# Patient Record
Sex: Female | Born: 1965 | Race: Black or African American | Hispanic: No | Marital: Married | Smoking: Never smoker
Health system: Southern US, Community
[De-identification: ages and names within clinical notes are randomized; demographics above are authoritative.]

## PROBLEM LIST (undated history)

## (undated) DIAGNOSIS — I10 Essential (primary) hypertension: Secondary | ICD-10-CM

## (undated) DIAGNOSIS — E119 Type 2 diabetes mellitus without complications: Secondary | ICD-10-CM

## (undated) HISTORY — PX: CHOLECYSTECTOMY: SHX55

## (undated) HISTORY — PX: ABDOMINAL HYSTERECTOMY: SHX81

---

## 1997-07-05 ENCOUNTER — Encounter (HOSPITAL_COMMUNITY): Admission: RE | Admit: 1997-07-05 | Discharge: 1997-10-03 | Payer: Self-pay

## 2014-12-12 ENCOUNTER — Emergency Department (HOSPITAL_BASED_OUTPATIENT_CLINIC_OR_DEPARTMENT_OTHER): Payer: 59

## 2014-12-12 ENCOUNTER — Encounter (HOSPITAL_BASED_OUTPATIENT_CLINIC_OR_DEPARTMENT_OTHER): Payer: Self-pay | Admitting: *Deleted

## 2014-12-12 ENCOUNTER — Emergency Department (HOSPITAL_BASED_OUTPATIENT_CLINIC_OR_DEPARTMENT_OTHER)
Admission: EM | Admit: 2014-12-12 | Discharge: 2014-12-12 | Disposition: A | Discharge status: Home / Self Care | Payer: 59 | Attending: Emergency Medicine | Admitting: Emergency Medicine

## 2014-12-12 DIAGNOSIS — Z79899 Other long term (current) drug therapy: Secondary | ICD-10-CM | POA: Insufficient documentation

## 2014-12-12 DIAGNOSIS — I10 Essential (primary) hypertension: Secondary | ICD-10-CM | POA: Diagnosis not present

## 2014-12-12 DIAGNOSIS — E119 Type 2 diabetes mellitus without complications: Secondary | ICD-10-CM | POA: Diagnosis not present

## 2014-12-12 DIAGNOSIS — R079 Chest pain, unspecified: Secondary | ICD-10-CM | POA: Insufficient documentation

## 2014-12-12 HISTORY — DX: Type 2 diabetes mellitus without complications: E11.9

## 2014-12-12 HISTORY — DX: Essential (primary) hypertension: I10

## 2014-12-12 LAB — CBC
HEMATOCRIT: 39.7 % (ref 36.0–46.0)
Hemoglobin: 13.8 g/dL (ref 12.0–15.0)
MCH: 26.8 pg (ref 26.0–34.0)
MCHC: 34.8 g/dL (ref 30.0–36.0)
MCV: 77.2 fL — AB (ref 78.0–100.0)
Platelets: 269 10*3/uL (ref 150–400)
RBC: 5.14 MIL/uL — ABNORMAL HIGH (ref 3.87–5.11)
RDW: 13.4 % (ref 11.5–15.5)
WBC: 9.6 10*3/uL (ref 4.0–10.5)

## 2014-12-12 LAB — BASIC METABOLIC PANEL
Anion gap: 6 (ref 5–15)
BUN: 11 mg/dL (ref 6–20)
CHLORIDE: 103 mmol/L (ref 101–111)
CO2: 27 mmol/L (ref 22–32)
CREATININE: 0.67 mg/dL (ref 0.44–1.00)
Calcium: 9.2 mg/dL (ref 8.9–10.3)
GFR calc Af Amer: >60 mL/min (ref >60)
GFR calc non Af Amer: >60 mL/min (ref >60)
GLUCOSE: 153 mg/dL — AB (ref 65–99)
POTASSIUM: 3.9 mmol/L (ref 3.5–5.1)
Sodium: 136 mmol/L (ref 135–145)

## 2014-12-12 LAB — TROPONIN I: Troponin I: <0.03 ng/mL (ref <0.031)

## 2014-12-12 MED ORDER — KETOROLAC TROMETHAMINE 30 MG/ML IJ SOLN
30.0000 mg | Freq: Once | INTRAMUSCULAR | Status: AC
  Administered 2014-12-12: 30 mg via INTRAVENOUS
  Filled 2014-12-12: qty 1

## 2014-12-12 NOTE — ED Provider Notes (Signed)
CSN: [id number]     Arrival date & time 12/12/14  1634 History   First MD Initiated Contact with Patient 12/12/14 1651     Chief Complaint  Patient presents with  . Chest Pain     (Consider location/radiation/quality/duration/timing/severity/associated sxs/prior Treatment) HPI Comments: Patient is a 49 year old female with history of hypertension and diabetes. She presents for evaluation of chest discomfort which started this morning on her way to work. He states that she feels a heaviness to the left upper chest that radiates to her shoulder. She denies any shortness of breath, nausea, diaphoresis. She denies any recent exertional symptoms and reports doing Zumba several times per week. She has no prior cardiac history and denies having ever had a stress test.  Patient is a 49 y.o. female presenting with chest pain. The history is provided by the patient.  Chest Pain Pain location:  L chest Pain quality: tightness   Pain radiates to:  L shoulder Pain radiates to the back: no   Pain severity:  Mild Onset quality:  Sudden Duration:  8 hours Timing:  Constant Progression:  Unchanged Chronicity:  New Relieved by:  Nothing Worsened by:  Nothing tried Ineffective treatments:  None tried   Past Medical History  Diagnosis Date  . Diabetes mellitus without complication (HCC)   . Hypertension    Past Surgical History  Procedure Laterality Date  . Abdominal hysterectomy    . Cholecystectomy     No family history on file. Social History  Substance Use Topics  . Smoking status: Never Smoker   . Smokeless tobacco: None  . Alcohol Use: Yes     Comment: occasionally   OB History    No data available     Review of Systems  Cardiovascular: Positive for chest pain.  All other systems reviewed and are negative.     Allergies  Codeine  Home Medications   Prior to Admission medications   Medication Sig Start Date End Date Taking? Authorizing Provider  Carvedilol (COREG PO)  Take by mouth.   Yes Historical Provider, MD  METFORMIN HCL PO Take by mouth.   Yes Historical Provider, MD   BP 160/98 mmHg  Pulse 90  Temp(Src) 98.1 F (36.7 C) (Oral)  Resp 20  Ht 5\' 5"  (1.651 m)  Wt 189 lb (85.73 kg)  BMI 31.45 kg/m2  SpO2 98% Physical Exam  Constitutional: She is oriented to person, place, and time. She appears well-developed and well-nourished. No distress.  HENT:  Head: Normocephalic and atraumatic.  Neck: Normal range of motion. Neck supple.  Cardiovascular: Normal rate and regular rhythm.  Exam reveals no gallop and no friction rub.   No murmur heard. Pulmonary/Chest: Effort normal and breath sounds normal. No respiratory distress. She has no wheezes.  Abdominal: Soft. Bowel sounds are normal. She exhibits no distension. There is no tenderness.  Musculoskeletal: Normal range of motion.  Neurological: She is alert and oriented to person, place, and time.  Skin: Skin is warm and dry. She is not diaphoretic.  Nursing note and vitals reviewed.   ED Course  Procedures (including critical care time) Labs Review Labs Reviewed  CBC - Abnormal; Notable for the following:    RBC 5.14 (*)    MCV 77.2 (*)    All other components within normal limits  BASIC METABOLIC PANEL  TROPONIN I    Imaging Review Dg Chest 2 View  12/12/2014  CLINICAL DATA:  49 year old female with left-sided chest pain and left arm numbness  EXAM: CHEST  2 VIEW COMPARISON:  None. FINDINGS: The lungs are clear and negative for focal airspace consolidation, pulmonary edema or suspicious pulmonary nodule. No pleural effusion or pneumothorax. Cardiac and mediastinal contours are within normal limits. No acute fracture or lytic or blastic osseous lesions. The visualized upper abdominal bowel gas pattern is unremarkable. Surgical clips in the right upper quadrant suggest prior cholecystectomy. IMPRESSION: Negative chest x-ray. Electronically Signed   By: Malachy Moan M.D.   On: 12/12/2014  17:03   I have personally reviewed and evaluated these images and lab results as part of my medical decision-making.   EKG Interpretation   Date/Time:  Thursday December 12 2014 16:45:28 EDT Ventricular Rate:  87 PR Interval:  148 QRS Duration: 76 QT Interval:  362 QTC Calculation: 435 R Axis:   -20 Text Interpretation:  Normal sinus rhythm Possible Left atrial enlargement  Left ventricular hypertrophy Abnormal ECG Confirmed by Louise Rawson  MD, Martyna Thorns  ([id number]) on 12/12/2014 4:54:18 PM      MDM   Final diagnoses:  None    Patient is a 49 year old female who presents with chest discomfort and left arm pain that is atypical for heart pain. Her EKG is normal and troponin is -8 hours after the onset of symptoms. She reports moving furniture when she was helping her son move and I suspect this may be musculoskeletal in nature. She will be given Toradol and I believe is appropriate for discharge. I will provide her with the follow-up information for [facility] cardiology office as she does have risk factors. She understands to return if her symptoms significantly worsen or change.    Geoffery Lyons, MD 12/12/14 [phone]

## 2014-12-12 NOTE — Discharge Instructions (Signed)
Ibuprofen 600 mg every 6 hours as needed for pain.  Follow-up with your primary Dr. to discuss a follow-up appointment with cardiology. A stress test may be in your best interest.  Return to the ER if symptoms significantly worsen or change.   Nonspecific Chest Pain  Chest pain can be caused by many different conditions. There is always a chance that your pain could be related to something serious, such as a heart attack or a blood clot in your lungs. Chest pain can also be caused by conditions that are not life-threatening. If you have chest pain, it is very important to follow up with your health care provider. CAUSES  Chest pain can be caused by:  Heartburn.  Pneumonia or bronchitis.  Anxiety or stress.  Inflammation around your heart (pericarditis) or lung (pleuritis or pleurisy).  A blood clot in your lung.  A collapsed lung (pneumothorax). It can develop suddenly on its own (spontaneous pneumothorax) or from trauma to the chest.  Shingles infection (varicella-zoster virus).  Heart attack.  Damage to the bones, muscles, and cartilage that make up your chest wall. This can include:  Bruised bones due to injury.  Strained muscles or cartilage due to frequent or repeated coughing or overwork.  Fracture to one or more ribs.  Sore cartilage due to inflammation (costochondritis). RISK FACTORS  Risk factors for chest pain may include:  Activities that increase your risk for trauma or injury to your chest.  Respiratory infections or conditions that cause frequent coughing.  Medical conditions or overeating that can cause heartburn.  Heart disease or family history of heart disease.  Conditions or health behaviors that increase your risk of developing a blood clot.  Having had chicken pox (varicella zoster). SIGNS AND SYMPTOMS Chest pain can feel like:  Burning or tingling on the surface of your chest or deep in your chest.  Crushing, pressure, aching, or squeezing  pain.  Dull or sharp pain that is worse when you move, cough, or take a deep breath.  Pain that is also felt in your back, neck, shoulder, or arm, or pain that spreads to any of these areas. Your chest pain may come and go, or it may stay constant. DIAGNOSIS Lab tests or other studies may be needed to find the cause of your pain. Your health care provider may have you take a test called an ambulatory ECG (electrocardiogram). An ECG records your heartbeat patterns at the time the test is performed. You may also have other tests, such as:  Transthoracic echocardiogram (TTE). During echocardiography, sound waves are used to create a picture of all of the heart structures and to look at how blood flows through your heart.  Transesophageal echocardiogram (TEE).This is a more advanced imaging test that obtains images from inside your body. It allows your health care provider to see your heart in finer detail.  Cardiac monitoring. This allows your health care provider to monitor your heart rate and rhythm in real time.  Holter monitor. This is a portable device that records your heartbeat and can help to diagnose abnormal heartbeats. It allows your health care provider to track your heart activity for several days, if needed.  Stress tests. These can be done through exercise or by taking medicine that makes your heart beat more quickly.  Blood tests.  Imaging tests. TREATMENT  Your treatment depends on what is causing your chest pain. Treatment may include:  Medicines. These may include:  Acid blockers for heartburn.  Anti-inflammatory medicine.  Pain medicine for inflammatory conditions.  Antibiotic medicine, if an infection is present.  Medicines to dissolve blood clots.  Medicines to treat coronary artery disease.  Supportive care for conditions that do not require medicines. This may include:  Resting.  Applying heat or cold packs to injured areas.  Limiting activities  until pain decreases. HOME CARE INSTRUCTIONS  If you were prescribed an antibiotic medicine, finish it all even if you start to feel better.  Avoid any activities that bring on chest pain.  Do not use any tobacco products, including cigarettes, chewing tobacco, or electronic cigarettes. If you need help quitting, ask your health care provider.  Do not drink alcohol.  Take medicines only as directed by your health care provider.  Keep all follow-up visits as directed by your health care provider. This is important. This includes any further testing if your chest pain does not go away.  If heartburn is the cause for your chest pain, you may be told to keep your head raised (elevated) while sleeping. This reduces the chance that acid will go from your stomach into your esophagus.  Make lifestyle changes as directed by your health care provider. These may include:  Getting regular exercise. Ask your health care provider to suggest some activities that are safe for you.  Eating a heart-healthy diet. A registered dietitian can help you to learn healthy eating options.  Maintaining a healthy weight.  Managing diabetes, if necessary.  Reducing stress. SEEK MEDICAL CARE IF:  Your chest pain does not go away after treatment.  You have a rash with blisters on your chest.  You have a fever. SEEK IMMEDIATE MEDICAL CARE IF:   Your chest pain is worse.  You have an increasing cough, or you cough up blood.  You have severe abdominal pain.  You have severe weakness.  You faint.  You have chills.  You have sudden, unexplained chest discomfort.  You have sudden, unexplained discomfort in your arms, back, neck, or jaw.  You have shortness of breath at any time.  You suddenly start to sweat, or your skin gets clammy.  You feel nauseous or you vomit.  You suddenly feel light-headed or dizzy.  Your heart begins to beat quickly, or it feels like it is skipping beats. These  symptoms may represent a serious problem that is an emergency. Do not wait to see if the symptoms will go away. Get medical help right away. Call your local emergency services (911 in the [place] Do not drive yourself to the hospital.   This information is not intended to replace advice given to you by your health care provider. Make sure you discuss any questions you have with your health care provider.   Document Released: 11/04/2004 Document Revised: 02/15/2014 Document Reviewed: 08/31/2013 Elsevier Interactive Patient Education [employer].

## 2014-12-12 NOTE — ED Notes (Signed)
Chest pain 3 days ago that went away. On the way to work this am she had pressure in her chest and tingling in her left arm.

## 2017-09-06 ENCOUNTER — Emergency Department (HOSPITAL_BASED_OUTPATIENT_CLINIC_OR_DEPARTMENT_OTHER)
Admission: EM | Admit: 2017-09-06 | Discharge: 2017-09-06 | Disposition: A | Discharge status: Home / Self Care | Payer: 59 | Attending: Emergency Medicine | Admitting: Emergency Medicine

## 2017-09-06 ENCOUNTER — Other Ambulatory Visit: Payer: Self-pay

## 2017-09-06 ENCOUNTER — Encounter (HOSPITAL_BASED_OUTPATIENT_CLINIC_OR_DEPARTMENT_OTHER): Payer: Self-pay | Admitting: *Deleted

## 2017-09-06 ENCOUNTER — Emergency Department (HOSPITAL_BASED_OUTPATIENT_CLINIC_OR_DEPARTMENT_OTHER): Payer: 59

## 2017-09-06 DIAGNOSIS — E119 Type 2 diabetes mellitus without complications: Secondary | ICD-10-CM | POA: Insufficient documentation

## 2017-09-06 DIAGNOSIS — S161XXA Strain of muscle, fascia and tendon at neck level, initial encounter: Secondary | ICD-10-CM | POA: Diagnosis not present

## 2017-09-06 DIAGNOSIS — S39012A Strain of muscle, fascia and tendon of lower back, initial encounter: Secondary | ICD-10-CM | POA: Insufficient documentation

## 2017-09-06 DIAGNOSIS — Y999 Unspecified external cause status: Secondary | ICD-10-CM | POA: Diagnosis not present

## 2017-09-06 DIAGNOSIS — Y9389 Activity, other specified: Secondary | ICD-10-CM | POA: Insufficient documentation

## 2017-09-06 DIAGNOSIS — I10 Essential (primary) hypertension: Secondary | ICD-10-CM | POA: Diagnosis not present

## 2017-09-06 DIAGNOSIS — S199XXA Unspecified injury of neck, initial encounter: Secondary | ICD-10-CM | POA: Diagnosis present

## 2017-09-06 DIAGNOSIS — Y92410 Unspecified street and highway as the place of occurrence of the external cause: Secondary | ICD-10-CM | POA: Diagnosis not present

## 2017-09-06 MED ORDER — METHOCARBAMOL 500 MG PO TABS
500.0000 mg | ORAL_TABLET | Freq: Once | ORAL | Status: AC
  Administered 2017-09-06: 500 mg via ORAL
  Filled 2017-09-06: qty 1

## 2017-09-06 MED ORDER — METHOCARBAMOL 500 MG PO TABS
500.0000 mg | ORAL_TABLET | Freq: Three times a day (TID) | ORAL | 0 refills | Status: AC | PRN
Start: 2017-09-06 — End: ?

## 2017-09-06 MED ORDER — IBUPROFEN 800 MG PO TABS
800.0000 mg | ORAL_TABLET | Freq: Once | ORAL | Status: AC
  Administered 2017-09-06: 800 mg via ORAL
  Filled 2017-09-06: qty 1

## 2017-09-06 MED ORDER — IBUPROFEN 800 MG PO TABS: 800.0000 mg | ORAL_TABLET | Freq: Three times a day (TID) | ORAL | 0 refills | Status: AC | PRN

## 2017-09-06 MED FILL — METHOCARBAMOL 500 MG TABLET: 500 | 7 days supply | Qty: 20 | Fill #0

## 2017-09-06 MED FILL — IBUPROFEN 800 MG TAB: 800 | 7 days supply | Qty: 21 | Fill #0
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
## 2017-09-06 NOTE — Discharge Instructions (Signed)

## 2017-09-06 NOTE — ED Notes (Signed)
Pt verbalized understanding of dc instructions.
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
## 2021-03-16 ENCOUNTER — Emergency Department (HOSPITAL_BASED_OUTPATIENT_CLINIC_OR_DEPARTMENT_OTHER)
Admission: EM | Admit: 2021-03-16 | Discharge: 2021-03-16 | Disposition: A | Discharge status: Home / Self Care | Payer: 59 | Attending: Emergency Medicine | Admitting: Emergency Medicine

## 2021-03-16 ENCOUNTER — Other Ambulatory Visit: Payer: Self-pay

## 2021-03-16 ENCOUNTER — Encounter (HOSPITAL_BASED_OUTPATIENT_CLINIC_OR_DEPARTMENT_OTHER): Payer: Self-pay | Admitting: *Deleted

## 2021-03-16 DIAGNOSIS — R000 Tachycardia, unspecified: Secondary | ICD-10-CM | POA: Insufficient documentation

## 2021-03-16 DIAGNOSIS — J029 Acute pharyngitis, unspecified: Secondary | ICD-10-CM | POA: Diagnosis present

## 2021-03-16 DIAGNOSIS — U071 COVID-19: Secondary | ICD-10-CM | POA: Diagnosis not present

## 2021-03-16 LAB — RESP PANEL BY RT-PCR (FLU A&B, COVID) ARPGX2
Influenza A by PCR: NEGATIVE
Influenza B by PCR: NEGATIVE
SARS Coronavirus 2 by RT PCR: POSITIVE — AB

## 2021-03-16 MED ORDER — NIRMATRELVIR/RITONAVIR (PAXLOVID)TABLET: 3.0000 | ORAL_TABLET | Freq: Two times a day (BID) | ORAL | 0 refills | Status: AC

## 2021-03-16 MED ORDER — BENZONATATE 100 MG PO CAPS: 100.0000 mg | ORAL_CAPSULE | Freq: Three times a day (TID) | ORAL | 0 refills | Status: AC

## 2021-03-16 MED ORDER — ACETAMINOPHEN 325 MG PO TABS
650.0000 mg | ORAL_TABLET | Freq: Once | ORAL | Status: AC
  Administered 2021-03-16: 650 mg via ORAL
  Filled 2021-03-16: qty 2

## 2021-03-16 MED ORDER — FLUTICASONE PROPIONATE 50 MCG/ACT NA SUSP: 2.0000 | Freq: Every day | NASAL | 0 refills | Status: AC

## 2021-03-16 NOTE — ED Provider Notes (Addendum)
[facility] DEPARTMENT Provider Note   CSN: [id number] Arrival date & time: 03/16/21  1844     History  Chief Complaint  Patient presents with   Covid Exposure    Hannah Horn is a 56 y.o. female here for evaluation of fever sore throat cough body aches headache myalgia, chest pain when coughing.  Significant other recently diagnosed with COVID.  She took a test at home this morning which was negative.  No meds PTA.  No pleuritic or exertional chest pain.  No back pain.  No paresthesias, lateral weakness.  Does feel generalized fatigue.  Tolerating p.o. intake.  HPI     Home Medications Prior to Admission medications   Medication Sig Start Date End Date Taking? Authorizing Provider  benzonatate (TESSALON) 100 MG capsule Take 1 capsule (100 mg total) by mouth every 8 (eight) hours. 03/16/21  Yes Bernardo Brayman A, PA-C  carvedilol (COREG CR) 40 MG 24 hr capsule Take 1 capsule by mouth daily. 07/13/16  Yes [provider]  fluticasone (FLONASE) 50 MCG/ACT nasal spray Place 2 sprays into both nostrils daily. 03/16/21  Yes Amar Sippel A, PA-C  nirmatrelvir/ritonavir EUA (PAXLOVID) 20 x 150 MG & 10 x 100MG  TABS Take 3 tablets by mouth 2 (two) times daily for 5 days. Patient GFR is normal Take nirmatrelvir (150 mg) two tablets twice daily for 5 days and ritonavir (100 mg) one tablet twice daily for 5 days. 03/16/21 03/21/21 Yes Chrystle Murillo A, PA-C  Semaglutide, 1 MG/DOSE, (OZEMPIC, 1 MG/DOSE,) 4 MG/3ML SOPN Inject 1 mg into the skin once a week. 01/12/21  Yes [provider]  Carvedilol (COREG PO) Take by mouth.    [provider]  ibuprofen (ADVIL,MOTRIN) 800 MG tablet Take 1 tablet (800 mg total) by mouth every 8 (eight) hours as needed for moderate pain. 09/06/17   Long, Wonda Olds, MD  METFORMIN HCL PO Take by mouth.    [provider]  methocarbamol (ROBAXIN) 500 MG tablet Take 1 tablet (500 mg total) by mouth every 8 (eight)  hours as needed for muscle spasms. 09/06/17   Long, Wonda Olds, MD      Allergies    Codeine    Review of Systems   Review of Systems  Constitutional:  Positive for activity change, appetite change, fatigue and fever.  HENT:  Positive for congestion, postnasal drip, rhinorrhea and sore throat. Negative for ear discharge, ear pain, facial swelling, sinus pressure, sinus pain and sneezing.   Respiratory:  Positive for cough and chest tightness (with coughing).   Cardiovascular: Negative.   Gastrointestinal: Negative.   Genitourinary: Negative.   Musculoskeletal: Negative.   Skin: Negative.   Neurological:  Positive for weakness (generalized) and headaches.  All other systems reviewed and are negative.  Physical Exam Updated Vital Signs BP 137/86 (BP Location: Right Arm)    Pulse (!) 109    Temp 98.9 F (37.2 C) (Oral)    Resp 14    Ht 5\' 5"  (1.651 m)    Wt 83 kg    SpO2 96%    BMI 30.45 kg/m  Physical Exam Vitals and nursing note reviewed.  Constitutional:      General: She is not in acute distress.    Appearance: She is well-developed. She is not ill-appearing, toxic-appearing or diaphoretic.  HENT:     Head: Normocephalic and atraumatic.     Nose: Nose normal.     Mouth/Throat:     Mouth: Mucous membranes  are moist.  Eyes:     Pupils: Pupils are equal, round, and reactive to light.  Cardiovascular:     Rate and Rhythm: Tachycardia present.     Pulses: Normal pulses.     Heart sounds: Normal heart sounds.  Pulmonary:     Effort: Pulmonary effort is normal. No respiratory distress.     Breath sounds: Normal breath sounds.     Comments: Clear bilateral, speaks in full sentences without difficulty Abdominal:     General: Bowel sounds are normal. There is no distension.     Palpations: Abdomen is soft.     Tenderness: There is no abdominal tenderness. There is no guarding or rebound.  Musculoskeletal:        General: Normal range of motion.     Cervical back: Normal range of  motion.     Comments: Full range of motion, no bony tenderness, compartments soft, no posterior calf tenderness  Skin:    General: Skin is warm and dry.     Capillary Refill: Capillary refill takes less than 2 seconds.     Comments: No edema, erythema or warmth.  Neurological:     General: No focal deficit present.     Mental Status: She is alert and oriented to person, place, and time.  Psychiatric:        Mood and Affect: Mood normal.    ED Results / Procedures / Treatments   Labs (all labs ordered are listed, but only abnormal results are displayed) Labs Reviewed  RESP PANEL BY RT-PCR (FLU A&B, COVID) ARPGX2 - Abnormal; Notable for the following components:      Result Value   SARS Coronavirus 2 by RT PCR POSITIVE (*)    All other components within normal limits    EKG EKG Interpretation  Date/Time:  Monday March 16 2021 19:15:02 EST Ventricular Rate:  81 PR Interval:  170 QRS Duration: 80 QT Interval:  400 QTC Calculation: 464 R Axis:   68 Text Interpretation: Normal sinus rhythm Possible Left atrial enlargement Borderline ECG When compared with ECG of 12-Dec-2014 16:45, PREVIOUS ECG IS PRESENT Confirmed by Thamas Jaegers ([id number]) on 03/16/2021 10:12:25 PM  Radiology No results found.  Procedures Procedures    Medications Ordered in ED Medications  acetaminophen (TYLENOL) tablet 650 mg (650 mg Oral Given 03/16/21 1906)    ED Course/ Medical Decision Making/ A&P    55 here for evaluation of respiratory symptoms.  On arrival patient febrile, tachycardic however does not appear septic or ill.  Significant other recently diagnosed with COVID.  Heart and lungs are clear.  Abdomen soft, nontender.  While she does endorse some chest pain with coughing of low suspicion with acute ACS, PE or dissection.  Pain is reproducible on exam, suspect her pain is due to her cough vital signs improving with Tylenol here.  She has no neck stiffness or neck rigidity to suggest meningitis.   She is tolerating p.o. intake.  Labs personally reviewed and interpreted:  COVID test positive  Suspect this is likely the cause of her symptoms.  Will DC home with symptomatic management, return for any worsening symptoms.  Patient agreeable.  The patient has been appropriately medically screened and/or stabilized in the ED. I have low suspicion for any other emergent medical condition which would require further screening, evaluation or treatment in the ED or require inpatient management.  Patient is hemodynamically stable and in no acute distress.  Patient able to ambulate in department prior to ED.  Evaluation does not show acute pathology that would require ongoing or additional emergent interventions while in the emergency department or further inpatient treatment.  I have discussed the diagnosis with the patient and answered all questions.  Pain is been managed while in the emergency department and patient has no further complaints prior to discharge.  Patient is comfortable with plan discussed in room and is stable for discharge at this time.  I have discussed strict return precautions for returning to the emergency department.  Patient was encouraged to follow-up with PCP/specialist refer to at discharge.                            Medical Decision Making Amount and/or Complexity of Data Reviewed External Data Reviewed: labs, radiology and notes. Labs: ordered. Decision-making details documented in ED Course.  Risk OTC drugs. Prescription drug management. Risk Details: Do not feel she needs additional labs, imaging or hospitalization at this time         Final Clinical Impression(s) / ED Diagnoses Final diagnoses:  COVID    Rx / DC Orders ED Discharge Orders          Ordered    benzonatate (TESSALON) 100 MG capsule  Every 8 hours        03/16/21 2210    fluticasone (FLONASE) 50 MCG/ACT nasal spray  Daily        03/16/21 2210    nirmatrelvir/ritonavir EUA  (PAXLOVID) 20 x 150 MG & 10 x 100MG  TABS  2 times daily        03/16/21 2220              \   Corbin Falck A, PA-C 03/16/21 2223    Luna Fuse, MD 03/20/21 1939

## 2021-03-16 NOTE — Discharge Instructions (Signed)
COVID test was positive here.  This is likely the cause of your symptoms  I have written for a few medications.  Take as prescribed.  Return for new or worsening symptoms.

## 2021-03-16 NOTE — ED Triage Notes (Signed)
Her husband has Covid. She woke this am with fever, sore throat and body aches.

## 2023-06-19 ENCOUNTER — Emergency Department (HOSPITAL_BASED_OUTPATIENT_CLINIC_OR_DEPARTMENT_OTHER): Payer: Self-pay

## 2023-06-19 ENCOUNTER — Emergency Department (HOSPITAL_BASED_OUTPATIENT_CLINIC_OR_DEPARTMENT_OTHER)
Admission: EM | Admit: 2023-06-19 | Discharge: 2023-06-19 | Disposition: A | Discharge status: Home / Self Care | Payer: Self-pay | Attending: Emergency Medicine | Admitting: Emergency Medicine

## 2023-06-19 ENCOUNTER — Other Ambulatory Visit: Payer: Self-pay

## 2023-06-19 ENCOUNTER — Encounter (HOSPITAL_BASED_OUTPATIENT_CLINIC_OR_DEPARTMENT_OTHER): Payer: Self-pay | Admitting: Emergency Medicine

## 2023-06-19 DIAGNOSIS — M25562 Pain in left knee: Secondary | ICD-10-CM | POA: Insufficient documentation

## 2023-06-19 MED ORDER — ACETAMINOPHEN 500 MG PO TABS
1000.0000 mg | ORAL_TABLET | Freq: Once | ORAL | Status: AC
Start: 2023-06-19 — End: 2023-06-19
  Administered 2023-06-19: 1000 mg via ORAL
  Filled 2023-06-19: qty 2

## 2023-06-19 MED ORDER — KETOROLAC TROMETHAMINE 15 MG/ML IJ SOLN
30.0000 mg | Freq: Once | INTRAMUSCULAR | Status: AC
  Administered 2023-06-19: 30 mg via INTRAMUSCULAR
  Filled 2023-06-19: qty 2

## 2023-06-19 MED ORDER — CELECOXIB 200 MG PO CAPS: 200.0000 mg | ORAL_CAPSULE | Freq: Two times a day (BID) | ORAL | 0 refills | Status: AC

## 2023-06-19 MED ORDER — ACETAMINOPHEN 500 MG PO TABS: 1000.0000 mg | ORAL_TABLET | Freq: Four times a day (QID) | ORAL | 0 refills | Status: AC | PRN

## 2023-06-19 MED ORDER — OXYCODONE HCL 5 MG PO TABS: 5.0000 mg | ORAL_TABLET | Freq: Four times a day (QID) | ORAL | 0 refills | Status: AC | PRN

## 2023-06-19 NOTE — ED Provider Notes (Signed)
 [facility] AT [facility] Provider Note   CSN: [id number] Arrival date & time: 06/19/23  0205     History Chief Complaint  Patient presents with   Leg Pain    HPI Hannah Horn is a 58 y.o. female presenting for months of left knee pain.  States that it is worse recently. Denies fevers chills nausea vomiting syncope shortness of breath.  States she supposed be going to [place] on vacation in 3 days and wants to get symptoms managed as best as possible.  Patient's recorded medical, surgical, social, medication list and allergies were reviewed in the Snapshot window as part of the initial history.   Review of Systems   Review of Systems  Constitutional:  Negative for chills and fever.  HENT:  Negative for ear pain and sore throat.   Eyes:  Negative for pain and visual disturbance.  Respiratory:  Negative for cough and shortness of breath.   Cardiovascular:  Negative for chest pain and palpitations.  Gastrointestinal:  Negative for abdominal pain and vomiting.  Genitourinary:  Negative for dysuria and hematuria.  Musculoskeletal:  Negative for arthralgias and back pain.  Skin:  Negative for color change and rash.  Neurological:  Negative for seizures and syncope.  All other systems reviewed and are negative.   Physical Exam Updated Vital Signs BP (!) 147/86 (BP Location: Left Arm)   Pulse 79   Temp 98 F (36.7 C) (Oral)   Resp 16   Ht 5\' 5"  (1.651 m)   Wt 78.9 kg   SpO2 98%   BMI 28.96 kg/m  Physical Exam Vitals and nursing note reviewed.  Constitutional:      General: She is not in acute distress.    Appearance: She is well-developed.  HENT:     Head: Normocephalic and atraumatic.  Eyes:     Conjunctiva/sclera: Conjunctivae normal.  Cardiovascular:     Rate and Rhythm: Normal rate and regular rhythm.     Heart sounds: No murmur heard. Pulmonary:     Effort: Pulmonary effort is normal. No respiratory distress.     Breath  sounds: Normal breath sounds.  Abdominal:     General: There is no distension.     Palpations: Abdomen is soft.     Tenderness: There is no abdominal tenderness. There is no right CVA tenderness or left CVA tenderness.  Musculoskeletal:        General: Tenderness present. No swelling or deformity. Normal range of motion.     Cervical back: Neck supple.  Skin:    General: Skin is warm and dry.  Neurological:     General: No focal deficit present.     Mental Status: She is alert and oriented to person, place, and time. Mental status is at baseline.     Cranial Nerves: No cranial nerve deficit.      ED Course/ Medical Decision Making/ A&P    Procedures Procedures   Medications Ordered in ED Medications  acetaminophen  (TYLENOL ) tablet 1,000 mg (1,000 mg Oral Given 06/19/23 0349)  ketorolac  (TORADOL ) 15 MG/ML injection 30 mg (30 mg Intramuscular Given 06/19/23 0350)    Medical Decision Making:   Hannah Horn is a 58 y.o. female who presented to the ED today with left knee pain detailed above.    Additional history discussed with patient's family/caregivers.  Complete initial physical exam performed, notably the patient  was hemodynamically stable no acute distress tolerating passive range of motion.  Reviewed and confirmed nursing documentation for past medical history, family history, social history.    Initial Assessment:   Patient's history of present on physical exam findings are most consistent with likely arthritic etiology given months of symptoms worsened with activity throughout the day. Likely osteoarthritis.  X-ray performed evaluate for traumatic/for orthopedic fracture.  Ultimately nondiagnostic for any acute pathology and patient symptoms are overall well-controlled with regiment provided here. Will provide patient with pain medication regimen to use during her trip and recommend she follow-up with orthopedic surgery locally for definitive care and  management. Septic arthritis considered grossly is likely due to duration of symptoms and lack of acute pathology.  Additionally lack of fever or overlying skin changes makes this less consistent. Disposition:  I have considered need for hospitalization, however, considering all of the above, I believe this patient is stable for discharge at this time.  Patient/family educated about specific return precautions for given chief complaint and symptoms.  Patient/family educated about follow-up with PCP and orthopedics.     Patient/family expressed understanding of return precautions and need for follow-up. Patient spoken to regarding all imaging and laboratory results and appropriate follow up for these results. All education provided in verbal form with additional information in written form. Time was allowed for answering of patient questions. Patient discharged.    Emergency Department Medication Summary:   Medications  acetaminophen  (TYLENOL ) tablet 1,000 mg (1,000 mg Oral Given 06/19/23 0349)  ketorolac  (TORADOL ) 15 MG/ML injection 30 mg (30 mg Intramuscular Given 06/19/23 0350)        Clinical Impression:  1. Acute pain of left knee      Discharge   Final Clinical Impression(s) / ED Diagnoses Final diagnoses:  Acute pain of left knee    Rx / DC Orders ED Discharge Orders          Ordered    celecoxib (CELEBREX) 200 MG capsule  2 times daily        06/19/23 0355    acetaminophen  (TYLENOL ) 500 MG tablet  Every 6 hours PRN        06/19/23 0355    oxyCODONE (ROXICODONE) 5 MG immediate release tablet  Every 6 hours PRN        06/19/23 0355              Abreanna Drawdy, MD 06/19/23 [phone]

## 2023-06-19 NOTE — ED Triage Notes (Addendum)
 L leg pain x 2 months worse the last two days. Pt pinpoints pain to knee and radiates to thigh. No redness or warmth, skin intact, no notable swelling. Pt does not recall any injury. She is ambulatory without assistance with a slight limp. Characterizes pain as aching, states worse with movement or pressure, rates it 10/10 when standing 6/10 when sitting. Denies smoking, HRT, long distance travel or clotting disorders. Pt taking OTC meds and tried Meloxicam she had from prior musculoskeletal issue.
